# Patient Record
Sex: Female | Born: 2000 | Hispanic: Yes | Marital: Single | Smoking: Never smoker
Health system: Southern US, Community
[De-identification: ages and names within clinical notes are randomized; demographics above are authoritative.]

## PROBLEM LIST (undated history)

## (undated) DIAGNOSIS — K5900 Constipation, unspecified: Secondary | ICD-10-CM

## (undated) DIAGNOSIS — R519 Headache, unspecified: Secondary | ICD-10-CM

## (undated) HISTORY — DX: Constipation, unspecified: K59.00

## (undated) HISTORY — PX: SKIN GRAFT: SHX250

## (undated) HISTORY — DX: Headache, unspecified: R51.9

---
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
## 2022-01-25 ENCOUNTER — Ambulatory Visit: Payer: Medicaid Other | Admitting: Sports Medicine

## 2022-05-02 ENCOUNTER — Other Ambulatory Visit: Payer: Self-pay | Admitting: Sports Medicine

## 2022-05-03 MED ORDER — AMPHETAMINE-DEXTROAMPHET ER 20 MG PO CP24: 20.0000 mg | ORAL_CAPSULE | Freq: Every day | ORAL | 0 refills | Status: DC

## 2022-06-25 ENCOUNTER — Other Ambulatory Visit: Payer: Self-pay | Admitting: Sports Medicine

## 2022-06-26 MED ORDER — AMPHETAMINE-DEXTROAMPHET ER 20 MG PO CP24: 20.0000 mg | ORAL_CAPSULE | Freq: Every day | ORAL | 0 refills | Status: DC

## 2022-08-15 ENCOUNTER — Ambulatory Visit: Payer: Self-pay | Admitting: Sports Medicine

## 2022-08-18 ENCOUNTER — Telehealth: Payer: Self-pay | Admitting: General Practice

## 2022-08-18 NOTE — Telephone Encounter (Signed)
Transition Care Management Follow-up Telephone Call Date of discharge and from where: 08/15/22 from [facility] How have you been since you were released from the hospital? Still having some abdominal pain.  Any questions or concerns? No  Items Reviewed: Did the pt receive and understand the discharge instructions provided? Yes  Medications obtained and verified? No  Other? No  Any new allergies since your discharge? No  Dietary orders reviewed? Yes Do you have support at home? Yes   Home Care and Equipment/Supplies: Were home health services ordered? no   Functional Questionnaire: (I = Independent and D = Dependent) ADLs: I  Bathing/Dressing- I  Meal Prep- I  Eating- I  Maintaining continence- I  Transferring/Ambulation- I  Managing Meds- I  Follow up appointments reviewed:  PCP Hospital f/u appt confirmed? Yes  Scheduled to see Dr. Dianah Field  on 08/21/22 @ 1600. [facility] f/u appt confirmed? No   Are transportation arrangements needed? No  If their condition worsens, is the pt aware to call PCP or go to the Emergency Dept.? Yes Was the patient provided with contact information for the PCP's office or ED? Yes Was to pt encouraged to call back with questions or concerns? Yes

## 2022-08-21 ENCOUNTER — Ambulatory Visit: Payer: Self-pay | Admitting: Sports Medicine

## 2022-10-04 ENCOUNTER — Telehealth: Payer: Self-pay | Admitting: Sports Medicine

## 2022-10-04 DIAGNOSIS — Z20828 Contact with and (suspected) exposure to other viral communicable diseases: Secondary | ICD-10-CM | POA: Insufficient documentation

## 2022-10-04 MED ORDER — OSELTAMIVIR PHOSPHATE 75 MG PO CAPS: 75.0000 mg | ORAL_CAPSULE | Freq: Every day | ORAL | 0 refills | Status: DC

## 2022-10-04 NOTE — Assessment & Plan Note (Signed)
Recent exposure to influenza and her family, the rest of the house is sick, she has not yet developed symptoms, adding prophylactic dose Tamiflu.

## 2022-10-04 NOTE — Telephone Encounter (Signed)
Exposure to influenza Recent exposure to influenza and her family, the rest of the house is sick, she has not yet developed symptoms, adding prophylactic dose Tamiflu.

## 2022-10-16 ENCOUNTER — Ambulatory Visit: Payer: Self-pay

## 2022-10-16 ENCOUNTER — Telehealth: Payer: Self-pay | Admitting: General Practice
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
## 2022-10-16 NOTE — Transitions of Care (Post Inpatient/ED Visit) (Signed)
   10/16/2022  Name: Diana Flores MRN: [id number] DOB: 03/23/2001  Today's TOC FU Call Status: Today's TOC FU Call Status:: Unsuccessul Call (1st Attempt) Unsuccessful Call (1st Attempt) Date: 10/16/22  Attempted to reach the patient regarding the most recent Inpatient/ED visit.  Follow Up Plan: Additional outreach attempts will be made to reach the patient to complete the Transitions of Care (Post Inpatient/ED visit) call.   Signature Tinnie Gens, RN BSN

## 2022-10-20 ENCOUNTER — Ambulatory Visit (INDEPENDENT_AMBULATORY_CARE_PROVIDER_SITE_OTHER): Payer: Self-pay | Admitting: Sports Medicine

## 2022-10-20 DIAGNOSIS — S5002XA Contusion of left elbow, initial encounter: Secondary | ICD-10-CM

## 2022-10-20 MED ORDER — TRAMADOL HCL 50 MG PO TABS: 50.0000 mg | ORAL_TABLET | Freq: Three times a day (TID) | ORAL | 0 refills | Status: DC | PRN
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
## 2022-11-20 ENCOUNTER — Encounter: Payer: Self-pay | Admitting: Sports Medicine

## 2022-11-20 DIAGNOSIS — K047 Periapical abscess without sinus: Secondary | ICD-10-CM | POA: Insufficient documentation

## 2022-11-20 MED ORDER — AMOXICILLIN-POT CLAVULANATE 875-125 MG PO TABS: 1.0000 | ORAL_TABLET | Freq: Two times a day (BID) | ORAL | 0 refills | Status: DC

## 2022-12-01 ENCOUNTER — Encounter: Payer: Self-pay | Admitting: Sports Medicine

## 2022-12-01 ENCOUNTER — Ambulatory Visit (INDEPENDENT_AMBULATORY_CARE_PROVIDER_SITE_OTHER): Payer: Medicaid Other | Admitting: Sports Medicine

## 2022-12-01 VITALS — BP 125/78 | HR 62 | Wt 167.0 lb

## 2022-12-01 DIAGNOSIS — E559 Vitamin D deficiency, unspecified: Secondary | ICD-10-CM | POA: Diagnosis not present

## 2022-12-01 DIAGNOSIS — Z8669 Personal history of other diseases of the nervous system and sense organs: Secondary | ICD-10-CM | POA: Diagnosis not present

## 2022-12-01 DIAGNOSIS — E538 Deficiency of other specified B group vitamins: Secondary | ICD-10-CM | POA: Diagnosis not present

## 2022-12-01 DIAGNOSIS — E6609 Other obesity due to excess calories: Secondary | ICD-10-CM

## 2022-12-01 DIAGNOSIS — Z6830 Body mass index (BMI) 30.0-30.9, adult: Secondary | ICD-10-CM

## 2022-12-01 DIAGNOSIS — E669 Obesity, unspecified: Secondary | ICD-10-CM | POA: Insufficient documentation

## 2022-12-01 DIAGNOSIS — E66811 Obesity, class 1: Secondary | ICD-10-CM

## 2022-12-01 MED ORDER — RIZATRIPTAN BENZOATE 10 MG PO TBDP: 10.0000 mg | ORAL_TABLET | ORAL | 3 refills | Status: AC | PRN

## 2022-12-01 NOTE — Assessment & Plan Note (Signed)
Adding routine labs 

## 2022-12-01 NOTE — Assessment & Plan Note (Signed)
Diana Flores is a pleasant 22 year old female, we have treated her for migraines a couple of years ago, they were classic in nature with headaches, photophobia. We had her on Maxalt and she was down to about 3 headache days per month. She has since stopped her topiramate. Unfortunately her migraines have worsened, Maxalt 5mg  was ineffective. She is unable to tell me how many headache days she has a month so we will start with abortive treatment only but 10 mg of Maxalt this time. She will diary her headaches over the next month and if more than 4-6 headache days over the next month we will restart topiramate as well.

## 2022-12-01 NOTE — Progress Notes (Signed)
    Procedures performed today:    None.  Independent interpretation of notes and tests performed by another provider:   None.  Brief History, Exam, Impression, and Recommendations:    History of migraine headaches Diana Flores is a pleasant 22 year old female, we have treated her for migraines a couple of years ago, they were classic in nature with headaches, photophobia. We had her on Maxalt and she was down to about 3 headache days per month. She has since stopped her topiramate. Unfortunately her migraines have worsened, Maxalt 5mg  was ineffective. She is unable to tell me how many headache days she has a month so we will start with abortive treatment only but 10 mg of Maxalt this time. She will diary her headaches over the next month and if more than 4-6 headache days over the next month we will restart topiramate as well.  Obesity Adding routine labs    ____________________________________________ Ihor Austin. Benjamin Stain, M.D., ABFM., CAQSM., AME. Primary Care and Sports Medicine [place] [facility]  Adjunct Professor of Family Medicine  [employer]  Restaurant manager, fast food

## 2022-12-02 LAB — CBC
HCT: 37.6 % (ref 35.0–45.0)
Hemoglobin: 12.6 g/dL (ref 11.7–15.5)
MCH: 30.5 pg (ref 27.0–33.0)
MCHC: 33.5 g/dL (ref 32.0–36.0)
MCV: 91 fL (ref 80.0–100.0)
MPV: 10.6 fL (ref 7.5–12.5)
Platelets: 273 10*3/uL (ref 140–400)
RBC: 4.13 10*6/uL (ref 3.80–5.10)
RDW: 11.8 % (ref 11.0–15.0)
WBC: 7.8 10*3/uL (ref 3.8–10.8)

## 2022-12-02 LAB — COMPREHENSIVE METABOLIC PANEL
AG Ratio: 2 (calc) (ref 1.0–2.5)
ALT: 10 U/L (ref 6–29)
AST: 12 U/L (ref 10–30)
Albumin: 4.6 g/dL (ref 3.6–5.1)
Alkaline phosphatase (APISO): 50 U/L (ref 31–125)
BUN: 9 mg/dL (ref 7–25)
CO2: 27 mmol/L (ref 20–32)
Calcium: 9.6 mg/dL (ref 8.6–10.2)
Chloride: 105 mmol/L (ref 98–110)
Creat: 0.63 mg/dL (ref 0.50–0.96)
Globulin: 2.3 g/dL (calc) (ref 1.9–3.7)
Glucose, Bld: 103 mg/dL — ABNORMAL HIGH (ref 65–99)
Potassium: 3.9 mmol/L (ref 3.5–5.3)
Sodium: 139 mmol/L (ref 135–146)
Total Bilirubin: 0.5 mg/dL (ref 0.2–1.2)
Total Protein: 6.9 g/dL (ref 6.1–8.1)

## 2022-12-02 LAB — LIPID PANEL
Cholesterol: 143 mg/dL (ref <200)
HDL: 44 mg/dL — ABNORMAL LOW (ref >=50)
LDL Cholesterol (Calc): 88 mg/dL (calc)
Non-HDL Cholesterol (Calc): 99 mg/dL (calc) (ref <130)
Total CHOL/HDL Ratio: 3.3 (calc) (ref <5.0)
Triglycerides: 37 mg/dL (ref <150)

## 2022-12-02 LAB — VITAMIN B12: Vitamin B-12: 517 pg/mL (ref 200–1100)

## 2022-12-02 LAB — HEMOGLOBIN A1C
Hgb A1c MFr Bld: 5.3 % of total Hgb (ref <5.7)
Mean Plasma Glucose: 105 mg/dL
eAG (mmol/L): 5.8 mmol/L

## 2022-12-02 LAB — VITAMIN D 25 HYDROXY (VIT D DEFICIENCY, FRACTURES): Vit D, 25-Hydroxy: 20 ng/mL — ABNORMAL LOW (ref 30–100)

## 2022-12-02 LAB — TSH: TSH: 1.07 mIU/L

## 2022-12-03 MED ORDER — VITAMIN D (ERGOCALCIFEROL) 1.25 MG (50000 UNIT) PO CAPS: 50000.0000 [IU] | ORAL_CAPSULE | ORAL | 0 refills | Status: DC

## 2022-12-03 NOTE — Addendum Note (Signed)
Addended by: Monica Becton on: 12/03/2022 11:02 AM   Modules accepted: Orders

## 2022-12-18 ENCOUNTER — Encounter: Payer: Self-pay | Admitting: Sports Medicine

## 2022-12-29 ENCOUNTER — Encounter: Payer: Self-pay | Admitting: Sports Medicine

## 2022-12-29 ENCOUNTER — Ambulatory Visit (INDEPENDENT_AMBULATORY_CARE_PROVIDER_SITE_OTHER): Payer: Medicaid Other | Admitting: Sports Medicine

## 2022-12-29 VITALS — BP 128/82 | HR 81

## 2022-12-29 DIAGNOSIS — F419 Anxiety disorder, unspecified: Secondary | ICD-10-CM

## 2022-12-29 DIAGNOSIS — Z8669 Personal history of other diseases of the nervous system and sense organs: Secondary | ICD-10-CM

## 2022-12-29 DIAGNOSIS — F32A Depression, unspecified: Secondary | ICD-10-CM

## 2022-12-29 MED ORDER — TOPIRAMATE 50 MG PO TABS
ORAL_TABLET | ORAL | 3 refills | Status: AC
Start: 2022-12-29 — End: ?

## 2022-12-29 NOTE — Assessment & Plan Note (Signed)
This is a very pleasant 22 year old female, she has a history of classic migraines with headaches, nausea, photophobia. Historically she was well-controlled on topiramate and Maxalt, as she discontinued topiramate her headaches worsen, we treated her conservatively and I asked her to do a migraine diary over the last month, she does report greater than 6 headache days per month, we are going to restart topiramate in an up taper and I would like her to continue to diary her headaches and we can revisit this in 6 weeks. If insufficient improvement on max tolerable topiramate we will consider Nurtec.

## 2022-12-29 NOTE — Assessment & Plan Note (Addendum)
During the visit I noticed somewhat of a flat affect, on further questioning it does sound like Diana Flores is experiencing some anxiety and depression. We discussed the benefits of pharmacotherapy and behavioral therapy, she will talk to her mom about it and let me know what avenue she would like to pursue. Of note uncontrolled mood disorders make it very difficult to control migraine headaches.  Update: Insurance not accepted at our behavioral therapist here, we will use [facility].

## 2022-12-29 NOTE — Progress Notes (Addendum)
    Procedures performed today:    None.  Independent interpretation of notes and tests performed by another provider:   None.  Brief History, Exam, Impression, and Recommendations:    History of migraine headaches This is a very pleasant 22 year old female, she has a history of classic migraines with headaches, nausea, photophobia. Historically she was well-controlled on topiramate  and Maxalt , as she discontinued topiramate  her headaches worsen, we treated her conservatively and I asked her to do a migraine diary over the last month, she does report greater than 6 headache days per month, we are going to restart topiramate  in an up taper and I would like her to continue to diary her headaches and we can revisit this in 6 weeks. If insufficient improvement on max tolerable topiramate  we will consider Nurtec.  Anxiety and depression During the visit I noticed somewhat of a flat affect, on further questioning it does sound like Maddyx is experiencing some anxiety and depression. We discussed the benefits of pharmacotherapy and behavioral therapy, she will talk to her mom about it and let me know what avenue she would like to pursue. Of note uncontrolled mood disorders make it very difficult to control migraine headaches.  Update: Insurance not accepted at our behavioral therapist here, we will use [facility].    ____________________________________________ Debby PARAS. Curtis, M.D., ABFM., CAQSM., AME. Primary Care and Sports Medicine [place] [facility]  Adjunct Professor of [facility] [employer] [facility] Medicine  [employer]  Restaurant Manager, [employer]

## 2023-01-04 NOTE — Addendum Note (Signed)
Addended by: Monica Becton on: 01/04/2023 01:17 PM   Modules accepted: Orders

## 2023-01-15 ENCOUNTER — Telehealth: Payer: Self-pay

## 2023-01-15 NOTE — Telephone Encounter (Signed)
LVM for patient to call back [phone], or to call PCP office to schedule follow up apt. AS, CMA  

## 2023-01-26 ENCOUNTER — Other Ambulatory Visit: Payer: Self-pay | Admitting: Sports Medicine

## 2023-01-30 ENCOUNTER — Encounter: Payer: Medicaid Other | Admitting: Sports Medicine

## 2023-02-09 ENCOUNTER — Ambulatory Visit: Payer: Medicaid Other | Admitting: Sports Medicine

## 2023-02-09 ENCOUNTER — Encounter: Payer: Self-pay | Admitting: Sports Medicine

## 2023-03-15 ENCOUNTER — Other Ambulatory Visit: Payer: Self-pay | Admitting: Sports Medicine

## 2023-03-15 MED ORDER — AMPHETAMINE-DEXTROAMPHET ER 20 MG PO CP24: 20.0000 mg | ORAL_CAPSULE | Freq: Every day | ORAL | 0 refills | Status: AC

## 2023-03-15 NOTE — Telephone Encounter (Signed)
Patient requesting Adderall XR 20mg  Last written 06/26/2022 Last OV05/24/2024 but no ADHD dx since 06/23/2021 No upcoming appt schld.

## 2023-05-09 ENCOUNTER — Ambulatory Visit (INDEPENDENT_AMBULATORY_CARE_PROVIDER_SITE_OTHER): Payer: Self-pay

## 2023-05-09 ENCOUNTER — Ambulatory Visit
Admission: RE | Admit: 2023-05-09 | Discharge: 2023-05-09 | Disposition: A | Discharge status: Home / Self Care | Payer: Medicaid Other | Source: Ambulatory Visit | Attending: Family Medicine | Admitting: Family Medicine

## 2023-05-09 VITALS — BP 114/79 | HR 75 | Temp 98.6°F | Resp 20 | Ht 62.0 in

## 2023-05-09 DIAGNOSIS — R0789 Other chest pain: Secondary | ICD-10-CM

## 2023-05-09 DIAGNOSIS — M940 Chondrocostal junction syndrome [Tietze]: Secondary | ICD-10-CM

## 2023-05-09 MED ORDER — PREDNISONE 50 MG PO TABS: ORAL_TABLET | ORAL | 0 refills | Status: AC

## 2023-05-09 NOTE — Discharge Instructions (Signed)
You have an inflammation between the rib cage and the breastbone (sternum) I am treating this with prednisone once a day for 5 days Once you have finished the prednisone may take ibuprofen or naproxen for the pain Chest x-ray is normal.  EKG is normal.  No evidence of heart or lung disease See Dr. Karie Schwalbe if not improving by next week

## 2023-05-09 NOTE — ED Provider Notes (Addendum)
[facility]    CSN: [id number] Arrival date & time: 05/09/23  1534      History   Chief Complaint Chief Complaint  Patient presents with   Heartburn    Chest pain throbbing pain - Entered by patient   Chest Pain    HPI Diana Flores is a 22 y.o. female.   Patient works as a Advertising copywriter.  She is very physically active.  She started having some fleeting right-sided chest pain a couple months ago.  It would always go away after a few minutes.  It was not related to food, or activity.  No upper respiratory infection or cough.  Pain is worse with a deep breath.  When she gets the pain she has a feeling of anxiety and rapid heart rate.  She is here because the pain is coming more often, and is more severe.  She mentioned to her mother who wanted her seen immediately.  No history of lung disease or asthma as a child.  No history of heart disease or family history of same    Past Medical History:  Diagnosis Date   Constipation    Headache     Patient Active Problem List   Diagnosis Date Noted   Anxiety and depression 12/29/2022   Obesity 12/01/2022   Periapical abscess 11/20/2022   Irritable bowel syndrome with constipation 12/21/2021   Annual physical exam 06/23/2021   History of migraine headaches 06/23/2021   ADHD 06/23/2021    Past Surgical History:  Procedure Laterality Date   SKIN GRAFT N/A     OB History   No obstetric history on file.      Home Medications    Prior to Admission medications   Medication Sig Start Date End Date Taking? Authorizing Provider  amphetamine-dextroamphetamine (ADDERALL XR) 20 MG 24 hr capsule Take 1 capsule (20 mg total) by mouth daily. 03/15/23  Yes Monica Becton, MD  linaclotide [facility]) 72 MCG capsule Take 1 capsule (72 mcg total) by mouth daily before breakfast. 12/28/21  Yes Monica Becton, MD  predniSONE (DELTASONE) 50 MG tablet Take once a day for 5 days.  Take with food 05/09/23  Yes Eustace Moore, MD  rizatriptan (MAXALT-MLT) 10 MG disintegrating tablet Take 1 tablet (10 mg total) by mouth as needed for migraine. May repeat in 2 hours if needed 12/01/22  Yes Monica Becton, MD  topiramate (TOPAMAX) 50 MG tablet One half tab by mouth daily for one week then one tab by mouth daily for a week then 1 tab by mouth twice daily. 12/29/22  Yes Monica Becton, MD  Vitamin D, Ergocalciferol, (DRISDOL) 1.25 MG (50000 UNIT) CAPS capsule TAKE 1 CAPSULE BY MOUTH ONCE A WEEK 01/26/23  Yes Monica Becton, MD    Family History History reviewed. No pertinent family history.  Social History Social History   Tobacco Use   Smoking status: Never   Smokeless tobacco: Never  Vaping Use   Vaping status: Never Used  Substance Use Topics   Alcohol use: Never   Drug use: Never     Allergies   Patient has no known allergies.   Review of Systems Review of Systems See HPI  Physical Exam Triage Vital Signs ED Triage Vitals  Encounter Vitals Group     BP 05/09/23 1554 114/79     Systolic BP Percentile --      Diastolic BP Percentile --      Pulse Rate 05/09/23 1554 75  Resp 05/09/23 1554 20     Temp 05/09/23 1554 98.6 F (37 C)     Temp Source 05/09/23 1554 Oral     SpO2 05/09/23 1554 99 %     Weight --      Height 05/09/23 1556 5\' 2"  (1.575 m)     Head Circumference --      Peak Flow --      Pain Score 05/09/23 1555 9     Pain Loc --      Pain Education --      Exclude from Growth Chart --    No data found.  Updated Vital Signs BP 114/79 (BP Location: Left Arm)   Pulse 75   Temp 98.6 F (37 C) (Oral)   Resp 20   Ht 5\' 2"  (1.575 m)   LMP 05/06/2023   SpO2 99%   BMI 30.54 kg/m      Physical Exam Constitutional:      General: She is not in acute distress.    Appearance: She is well-developed. She is not ill-appearing.  HENT:     Head: Normocephalic and atraumatic.  Eyes:     Conjunctiva/sclera: Conjunctivae normal.     Pupils: Pupils  are equal, round, and reactive to light.  Neck:     Thyroid: No thyromegaly.  Cardiovascular:     Rate and Rhythm: Normal rate and regular rhythm.     Heart sounds: Normal heart sounds. No murmur heard. Pulmonary:     Effort: Pulmonary effort is normal. No respiratory distress.     Breath sounds: Normal breath sounds.  Chest:     Chest wall: Tenderness present.     Comments: Chest wall tenderness at rib 2 at the sternal border Abdominal:     General: There is no distension.     Palpations: Abdomen is soft.  Musculoskeletal:        General: Normal range of motion.     Cervical back: Normal range of motion.  Lymphadenopathy:     Cervical: No cervical adenopathy.  Skin:    General: Skin is warm and dry.  Neurological:     Mental Status: She is alert.      UC Treatments / Results  Labs (all labs ordered are listed, but only abnormal results are displayed) Labs Reviewed - No data to display  EKG   Radiology DG Chest 2 View  Result Date: 05/09/2023 CLINICAL DATA:  Chest pain for 4 months. EXAM: CHEST - 2 VIEW COMPARISON:  None Available. FINDINGS: The heart size and mediastinal contours are within normal limits. Both lungs are clear. The visualized skeletal structures are unremarkable. IMPRESSION: No active cardiopulmonary disease. Electronically Signed   By: Lupita Raider M.D.   On: 05/09/2023 17:18    Procedures Procedures (including critical care time)  Medications Ordered in UC Medications - No data to display  Initial Impression / Assessment and Plan / UC Course  I have reviewed the triage vital signs and the nursing notes.  Pertinent labs & imaging results that were available during my care of the patient were reviewed by me and considered in my medical decision making (see chart for details).     EKG without acute findings.  Nonspecific possible right axis deviation.  Chest x-ray is normal.  Reassured patient that I feel she has chest wall pain that is  noncardiac, and therefore not serious.  Follow-up with Dr. Karie Schwalbe Final Clinical Impressions(s) / UC Diagnoses   Final diagnoses:  Costochondritis  Chest wall pain     Discharge Instructions      You have an inflammation between the rib cage and the breastbone (sternum) I am treating this with prednisone once a day for 5 days Once you have finished the prednisone may take ibuprofen or naproxen for the pain Chest x-ray is normal.  EKG is normal.  No evidence of heart or lung disease See Dr. Karie Schwalbe if not improving by next week     ED Prescriptions     Medication Sig Dispense Auth. Provider   predniSONE (DELTASONE) 50 MG tablet Take once a day for 5 days.  Take with food 5 tablet Eustace Moore, MD      PDMP not reviewed this encounter.   Eustace Moore, MD 05/09/23 1713    Eustace Moore, MD 05/09/23 1725

## 2023-05-09 NOTE — ED Triage Notes (Signed)
Patient c/o right sided chest pain on and off x 4 months.  The pain is described as throbbing pain causing a headache that lasts x 2 days.  Denies any HTN, dizziness or SOB.  Some nausea, no vomiting.  Denies any OTC meds.

## 2023-05-28 IMAGING — DX DG ABDOMEN 2V
3 series · 3 of 3 positions shown · non-contrast
Comparison: None Available.

CLINICAL DATA: Right upper quadrant pain, constipation

EXAM:
ABDOMEN - 2 VIEW

[abdomen erect]
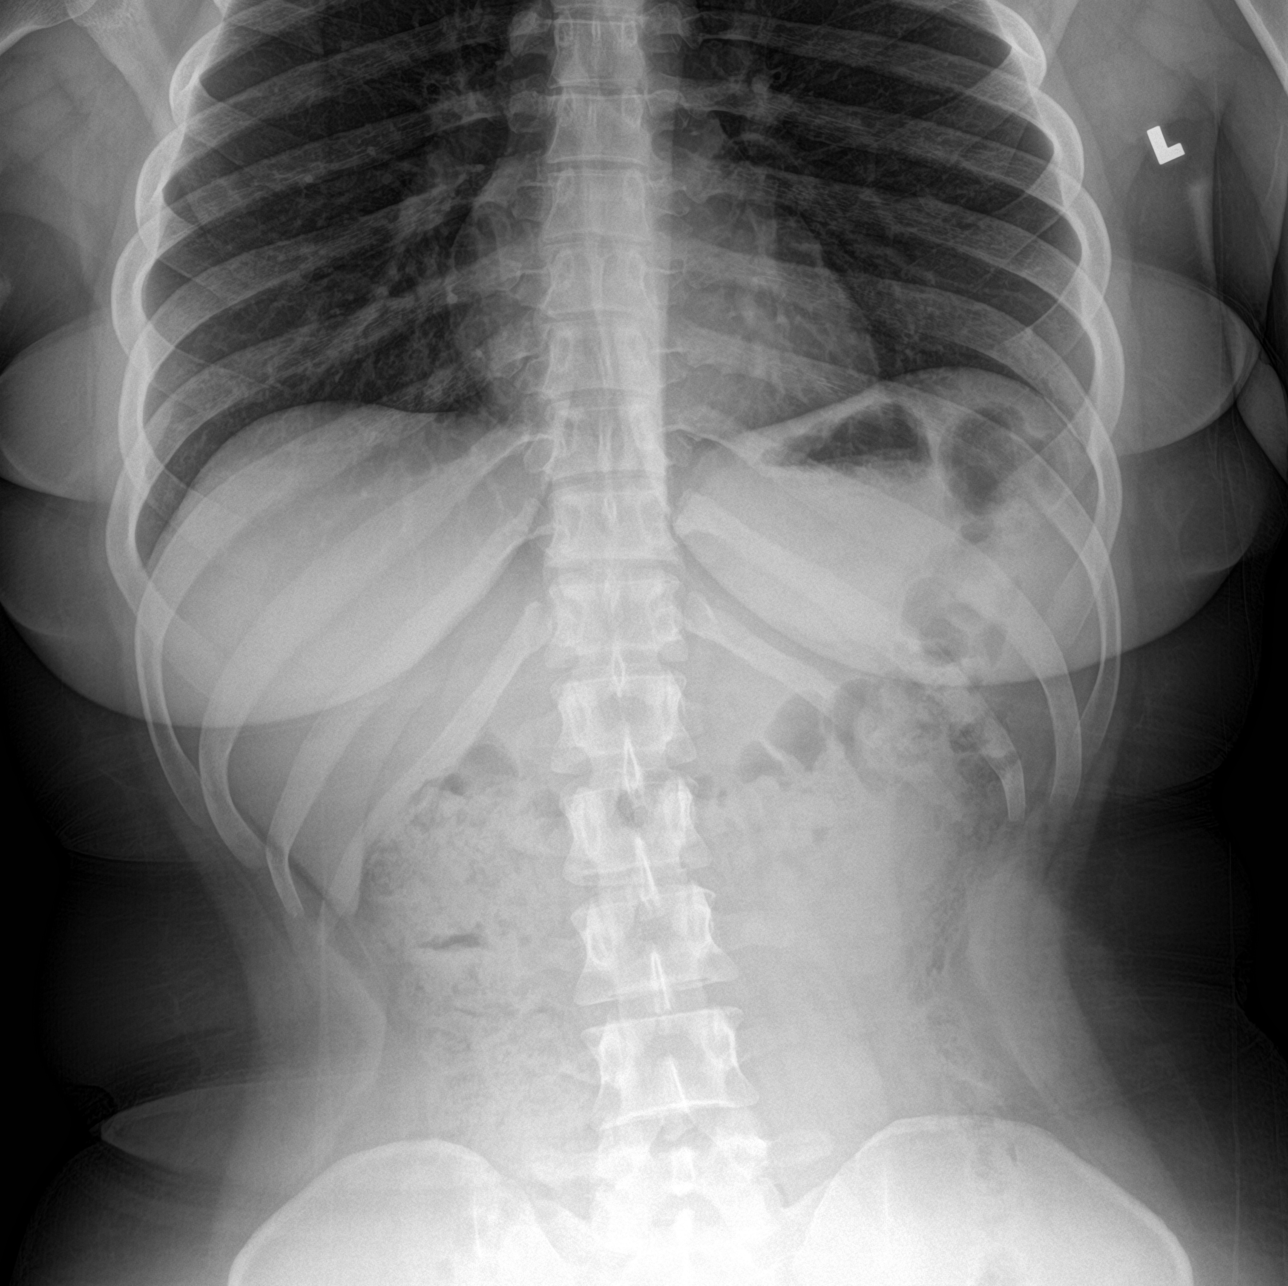

[abdomen supine (1 of 2)]
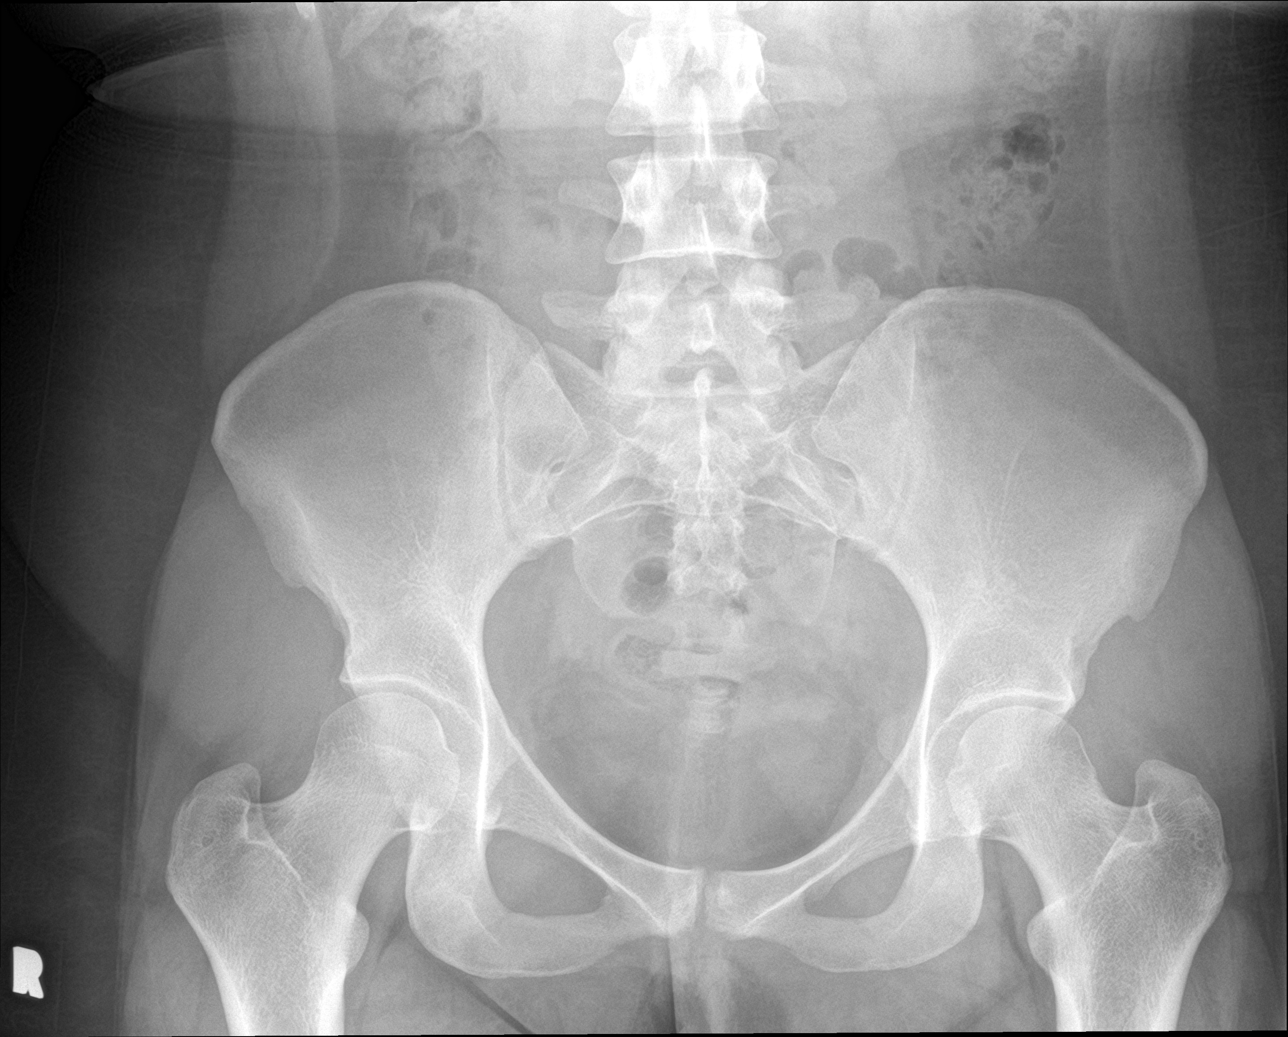

[abdomen supine (2 of 2)]
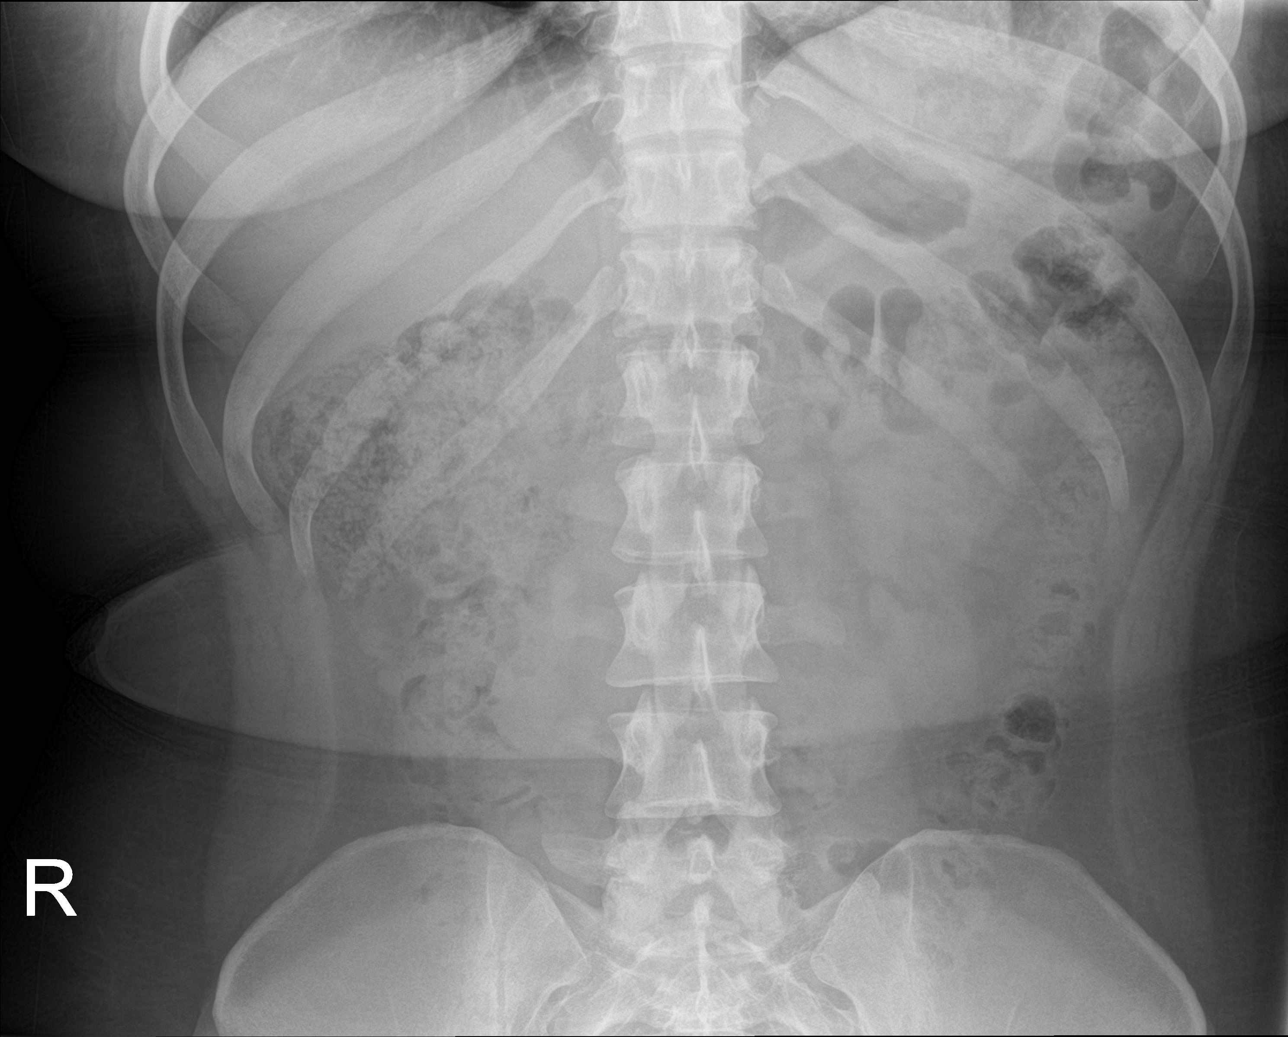

[3 of 3 positions shown; findings below may reference images not displayed]

FINDINGS: The bowel gas pattern is normal. There is no evidence of free air.
No radio-opaque calculi or other significant radiographic
abnormality is seen. Prominent colonic stool burden, most
concentrated in the hepatic flexure in the right abdomen.
IMPRESSION: No acute finding or obstruction pattern. Prominent right colon stool
burden.

## 2024-04-08 ENCOUNTER — Encounter: Payer: Self-pay | Admitting: Sports Medicine
